# Patient Record
Sex: Female | Born: 1975 | Marital: Single
Health system: Southern US, Community
[De-identification: ages and names within clinical notes are randomized; demographics above are authoritative.]

---

## 2014-05-11 ENCOUNTER — Other Ambulatory Visit: Payer: Self-pay | Admitting: Gastroenterology

## 2014-05-11 DIAGNOSIS — R1010 Upper abdominal pain, unspecified: Secondary | ICD-10-CM

## 2014-05-12 ENCOUNTER — Other Ambulatory Visit: Payer: Self-pay

## 2014-05-20 ENCOUNTER — Other Ambulatory Visit: Payer: Self-pay

## 2014-05-25 ENCOUNTER — Ambulatory Visit
Admission: RE | Admit: 2014-05-25 | Discharge: 2014-05-25 | Disposition: A | Discharge status: Home / Self Care | Payer: 59 | Source: Ambulatory Visit | Attending: Gastroenterology | Admitting: Gastroenterology

## 2014-05-25 ENCOUNTER — Encounter (INDEPENDENT_AMBULATORY_CARE_PROVIDER_SITE_OTHER): Payer: Self-pay

## 2014-05-25 DIAGNOSIS — R1010 Upper abdominal pain, unspecified: Secondary | ICD-10-CM

## 2014-07-15 ENCOUNTER — Other Ambulatory Visit: Payer: Self-pay | Admitting: Family Medicine

## 2014-07-15 ENCOUNTER — Other Ambulatory Visit (HOSPITAL_COMMUNITY)
Admission: RE | Admit: 2014-07-15 | Discharge: 2014-07-15 | Disposition: A | Discharge status: Home / Self Care | Payer: 59 | Source: Ambulatory Visit | Attending: Family Medicine | Admitting: Family Medicine

## 2014-07-15 DIAGNOSIS — Z124 Encounter for screening for malignant neoplasm of cervix: Secondary | ICD-10-CM | POA: Insufficient documentation

## 2014-07-15 DIAGNOSIS — Z1151 Encounter for screening for human papillomavirus (HPV): Secondary | ICD-10-CM | POA: Insufficient documentation

## 2014-07-16 LAB — CYTOLOGY - PAP

## 2015-07-28 ENCOUNTER — Other Ambulatory Visit: Payer: Self-pay | Admitting: Family Medicine

## 2015-07-28 DIAGNOSIS — M25562 Pain in left knee: Secondary | ICD-10-CM

## 2016-01-17 IMAGING — US US ABDOMEN COMPLETE
1 series · 14 of 25 positions shown · non-contrast
Comparison: None.

CLINICAL DATA: Upper abdominal pain

EXAM:
ULTRASOUND ABDOMEN COMPLETE

[Series 1: us abdomen complete · 0.32mm/px · 14 of 75 slices shown]
[im 1/75]
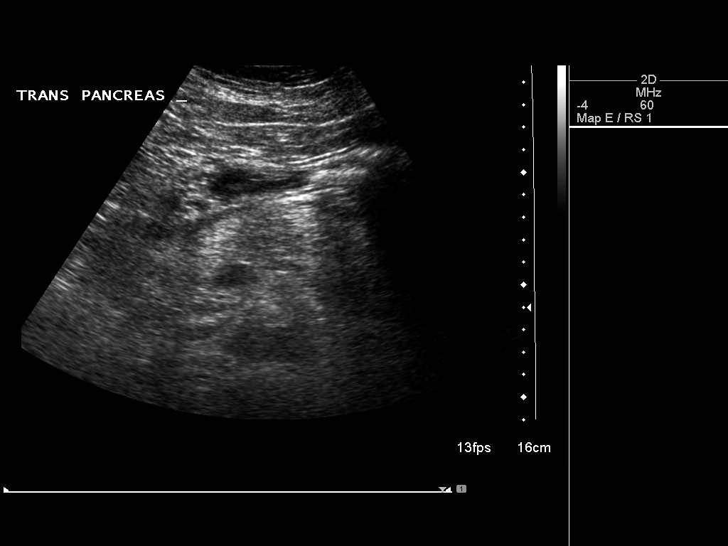
[im 7/75]
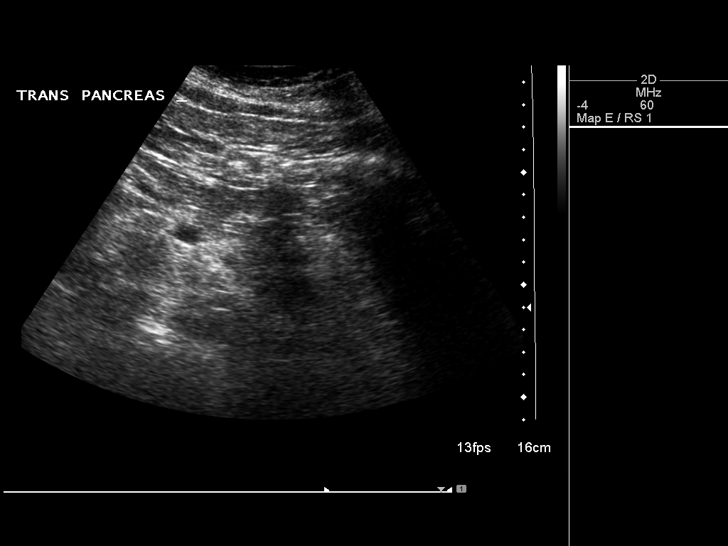
[im 13/75]
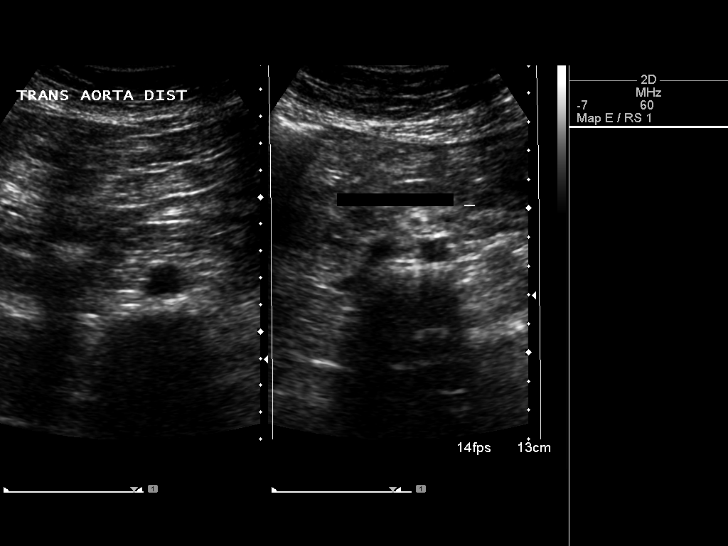
[im 19/75]
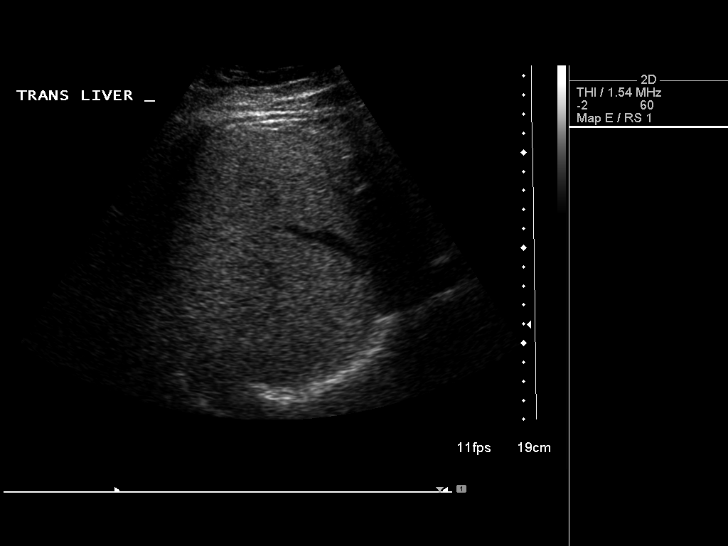
[im 25/75]
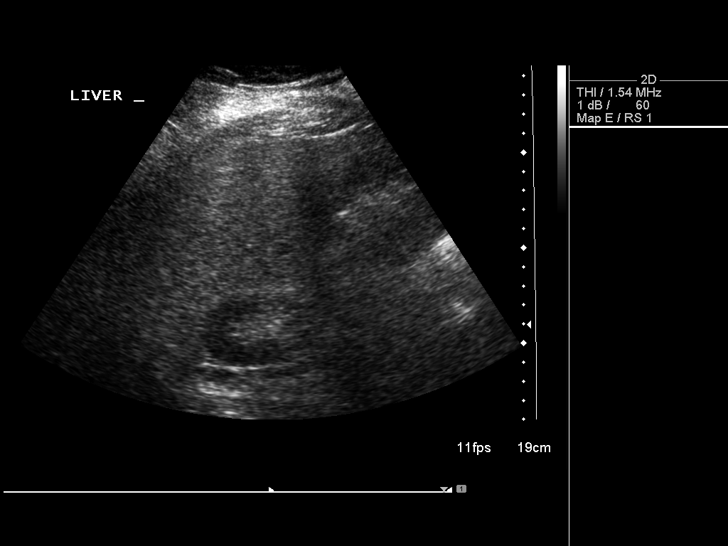
[im 28/75]
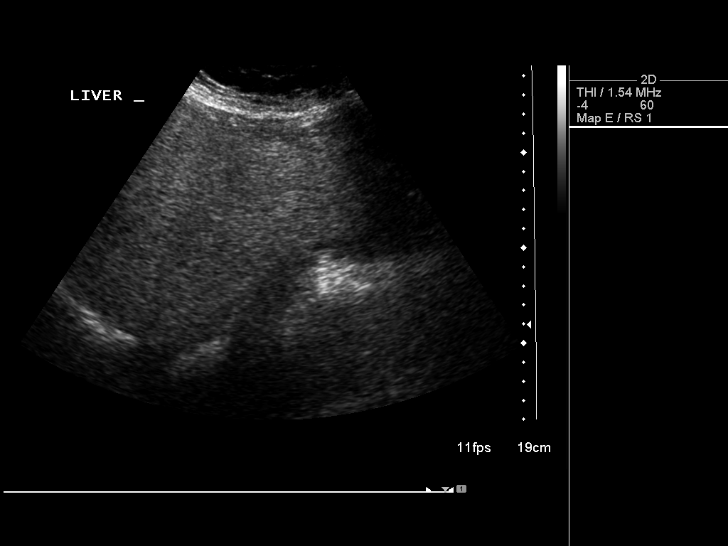
[im 34/75]
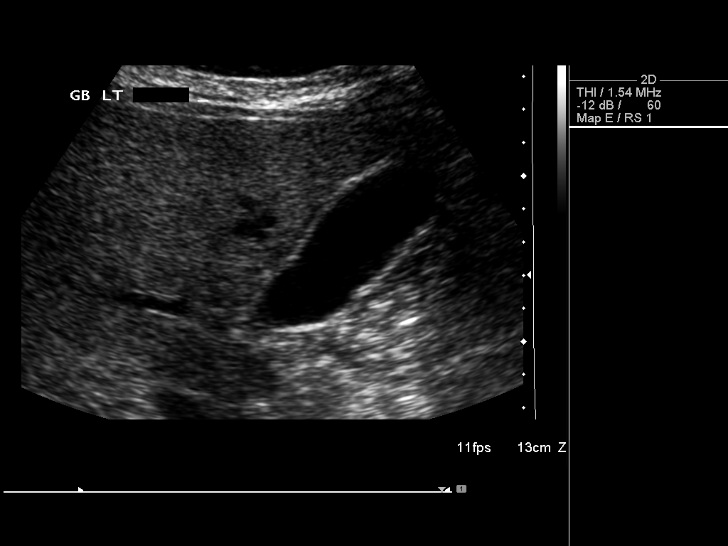
[im 41/75]
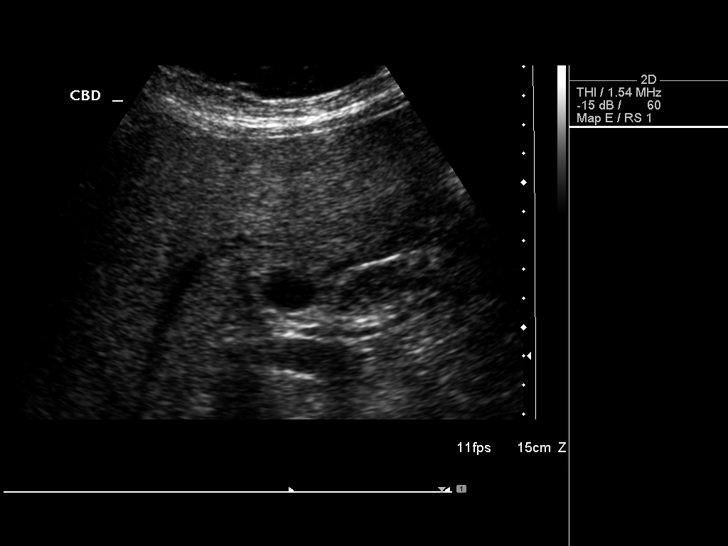
[im 47/75]
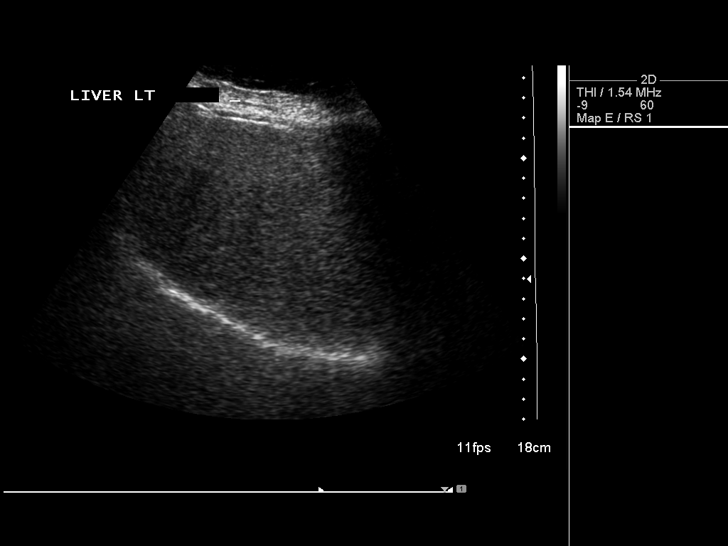
[im 50/75]
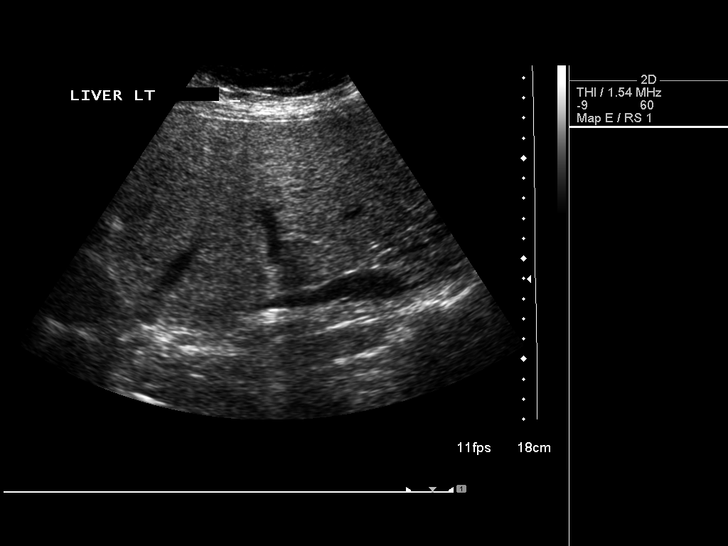
[im 56/75]
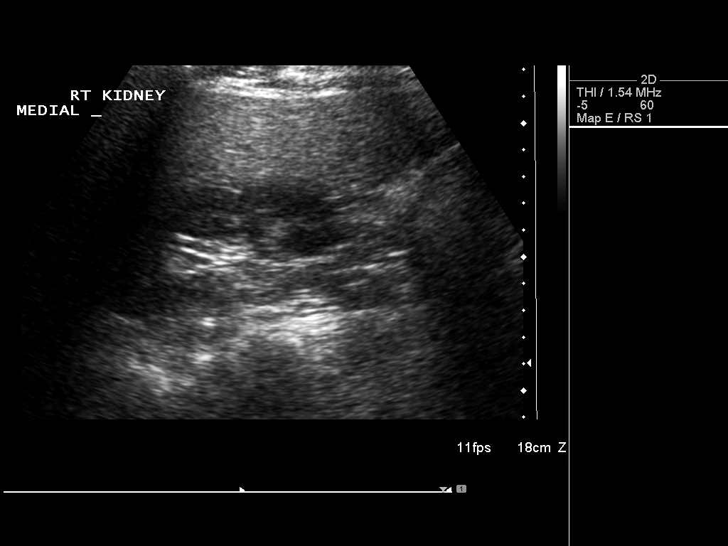
[im 62/75]
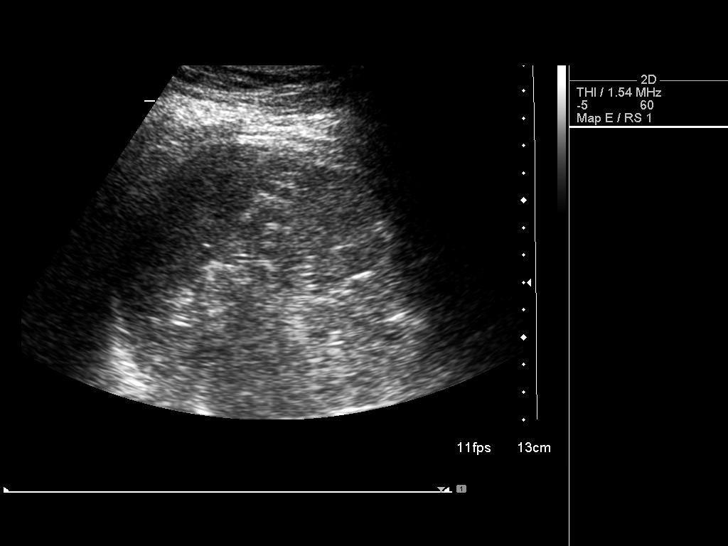
[im 68/75]
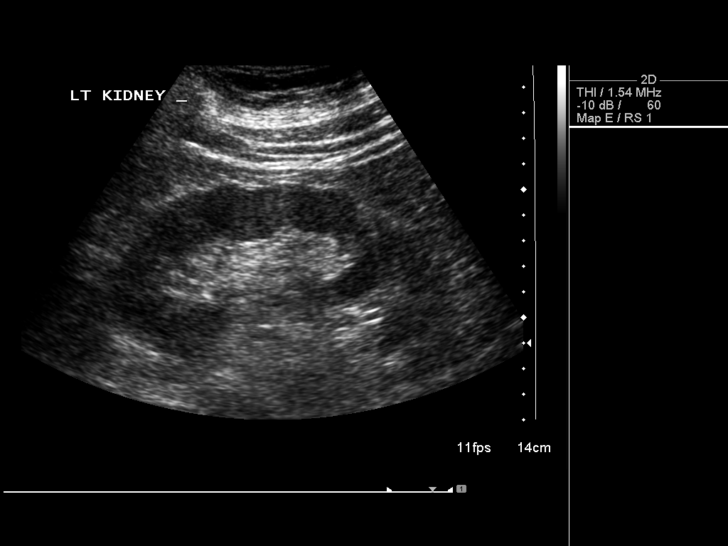
[im 75/75]
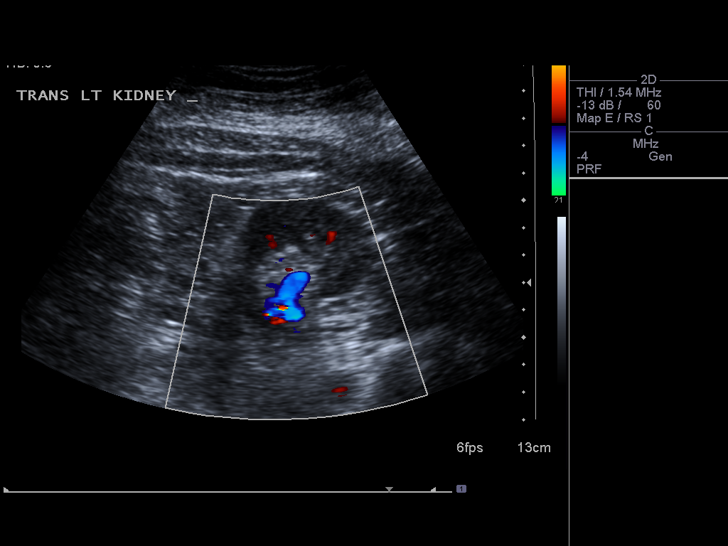

[14 of 25 positions shown; findings below may reference images not displayed]

FINDINGS: Gallbladder:

The gallbladder is visualized and no gallstones are noted. There is
no pain over the gallbladder with compression.

Common bile duct:

Diameter: The common bile duct is normal measuring 2.7 mm in
diameter.

Liver:

The liver is diffusely echogenic and inhomogeneous consistent with
fatty infiltration. No focal abnormality is seen.

IVC:

No abnormality visualized.

Pancreas:

The head and the tail of the pancreas are obscured by bowel gas.

Spleen:

The spleen is normal measuring 3.9 cm sagittally.

Right Kidney:

Length: 12.0 cm..  No hydronephrosis is seen.

Left Kidney:

Length: 12.3 cm..  No hydronephrosis is noted.

Abdominal aorta:

The abdominal aorta is normal caliber.

Other findings:

None.
IMPRESSION: 1. Diffusely echogenic and inhomogeneous liver consistent with fatty
infiltration. No focal abnormality.
2. No gallstones.
3. The head and tail of the pancreas are obscured by bowel gas.
